# Patient Record
Sex: Female | Born: 1995 | Hispanic: No | Marital: Single | Smoking: Never smoker
Health system: Southern US, Community
[De-identification: ages and names within clinical notes are randomized; demographics above are authoritative.]

## PROBLEM LIST (undated history)

## (undated) DIAGNOSIS — M92529 Juvenile osteochondrosis of tibia tubercle, unspecified leg: Secondary | ICD-10-CM

## (undated) DIAGNOSIS — G43111 Migraine with aura, intractable, with status migrainosus: Secondary | ICD-10-CM

## (undated) DIAGNOSIS — R87629 Unspecified abnormal cytological findings in specimens from vagina: Secondary | ICD-10-CM

## (undated) DIAGNOSIS — F319 Bipolar disorder, unspecified: Secondary | ICD-10-CM

## (undated) DIAGNOSIS — I7300 Raynaud's syndrome without gangrene: Secondary | ICD-10-CM

## (undated) DIAGNOSIS — F909 Attention-deficit hyperactivity disorder, unspecified type: Secondary | ICD-10-CM

## (undated) HISTORY — PX: FOOT SURGERY: SHX648

## (undated) HISTORY — DX: Attention-deficit hyperactivity disorder, unspecified type: F90.9

## (undated) HISTORY — PX: WISDOM TOOTH EXTRACTION: SHX21

---
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
## 2019-01-08 ENCOUNTER — Other Ambulatory Visit: Payer: Self-pay

## 2019-01-08 DIAGNOSIS — Z20822 Contact with and (suspected) exposure to covid-19: Secondary | ICD-10-CM

## 2019-01-10 LAB — NOVEL CORONAVIRUS, NAA: SARS-CoV-2, NAA: NOT DETECTED

## 2019-02-21 ENCOUNTER — Ambulatory Visit
Admission: EM | Admit: 2019-02-21 | Discharge: 2019-02-21 | Disposition: A | Discharge status: Home / Self Care | Payer: BC Managed Care – PPO | Attending: Urgent Care | Admitting: Urgent Care

## 2019-02-21 ENCOUNTER — Other Ambulatory Visit: Payer: Self-pay

## 2019-02-21 DIAGNOSIS — R519 Headache, unspecified: Secondary | ICD-10-CM | POA: Diagnosis present

## 2019-02-21 DIAGNOSIS — R05 Cough: Secondary | ICD-10-CM | POA: Insufficient documentation

## 2019-02-21 DIAGNOSIS — B349 Viral infection, unspecified: Secondary | ICD-10-CM | POA: Insufficient documentation

## 2019-02-21 DIAGNOSIS — Z20828 Contact with and (suspected) exposure to other viral communicable diseases: Secondary | ICD-10-CM | POA: Insufficient documentation

## 2019-02-21 DIAGNOSIS — R070 Pain in throat: Secondary | ICD-10-CM | POA: Diagnosis present

## 2019-02-21 DIAGNOSIS — Z20822 Contact with and (suspected) exposure to covid-19: Secondary | ICD-10-CM

## 2019-02-21 DIAGNOSIS — R059 Cough, unspecified: Secondary | ICD-10-CM

## 2019-02-21 LAB — POCT RAPID STREP A (OFFICE): Rapid Strep A Screen: NEGATIVE

## 2019-02-21 MED ORDER — BENZONATATE 100 MG PO CAPS: 100.0000 mg | ORAL_CAPSULE | Freq: Three times a day (TID) | ORAL | 0 refills | Status: DC | PRN

## 2019-02-21 MED ORDER — PROMETHAZINE-DM 6.25-15 MG/5ML PO SYRP: 5.0000 mL | ORAL_SOLUTION | Freq: Every evening | ORAL | 0 refills | Status: DC | PRN

## 2019-02-21 NOTE — Discharge Instructions (Signed)

## 2019-02-21 NOTE — ED Provider Notes (Signed)
[facility]   MRN: [id number] DOB: 03/03/1995  Subjective:   Amy Randolph is a 23 y.o. female presenting for 2-day history of acute onset mild persistent malaise.  Has had some generalized headaches, moderately stuffy nose in the mild slight cough.  She took some ibuprofen today with some relief.  Denies any known COVID-19 contacts but was in a large crowd of people this past week.  Denies taking chronic medications.    No Known Allergies  Past Medical History:  Diagnosis Date  . ADHD (attention deficit hyperactivity disorder)      Past Surgical History:  Procedure Laterality Date  . WISDOM TOOTH EXTRACTION      Family History  Problem Relation Age of Onset  . Healthy Mother   . Healthy Father     Social History   Tobacco Use  . Smoking status: Never Smoker  . Smokeless tobacco: Never Used  Substance Use Topics  . Alcohol use: Not on file  . Drug use: Not on file    Review of Systems  Constitutional: Negative for fever and malaise/fatigue.  HENT: Positive for sore throat. Negative for congestion, ear pain and sinus pain.   Eyes: Negative for discharge and redness.  Respiratory: Positive for cough. Negative for hemoptysis, shortness of breath and wheezing.   Cardiovascular: Negative for chest pain.  Gastrointestinal: Negative for abdominal pain, diarrhea, nausea and vomiting.  Genitourinary: Negative for dysuria, flank pain and hematuria.  Musculoskeletal: Negative for myalgias.  Skin: Negative for rash.  Neurological: Positive for headaches. Negative for dizziness and weakness.  Psychiatric/Behavioral: Negative for depression and substance abuse.     Objective:   Vitals: BP 118/76 (BP Location: Right Arm)   Pulse 90   Temp 99.6 F (37.6 C) (Oral)   Resp 15   SpO2 98%   Physical Exam Constitutional:      General: She is not in acute distress.    Appearance: Normal appearance. She is well-developed. She is not ill-appearing,  toxic-appearing or diaphoretic.  HENT:     Head: Normocephalic and atraumatic.     Right Ear: Tympanic membrane and ear canal normal. No drainage or tenderness. No middle ear effusion. Tympanic membrane is not erythematous.     Left Ear: Tympanic membrane and ear canal normal. No drainage or tenderness.  No middle ear effusion. Tympanic membrane is not erythematous.     Nose: Nose normal. No congestion or rhinorrhea.     Mouth/Throat:     Mouth: Mucous membranes are moist. No oral lesions.     Pharynx: No pharyngeal swelling, oropharyngeal exudate, posterior oropharyngeal erythema or uvula swelling.     Tonsils: No tonsillar exudate or tonsillar abscesses.     Comments: Significant post-nasal drainage. Eyes:     Extraocular Movements: Extraocular movements intact.     Right eye: Normal extraocular motion.     Left eye: Normal extraocular motion.     Conjunctiva/sclera: Conjunctivae normal.     Pupils: Pupils are equal, round, and reactive to light.  Cardiovascular:     Rate and Rhythm: Normal rate and regular rhythm.     Pulses: Normal pulses.     Heart sounds: Normal heart sounds. No murmur. No friction rub. No gallop.   Pulmonary:     Effort: Pulmonary effort is normal. No respiratory distress.     Breath sounds: Normal breath sounds. No stridor. No wheezing, rhonchi or rales.  Musculoskeletal:     Cervical back: Normal range of motion and neck supple.  Lymphadenopathy:     Cervical: No cervical adenopathy.  Skin:    General: Skin is warm and dry.     Findings: No rash.  Neurological:     General: No focal deficit present.     Mental Status: She is alert and oriented to person, place, and time.  Psychiatric:        Mood and Affect: Mood normal.        Behavior: Behavior normal.        Thought Content: Thought content normal.     Results for orders placed or performed during the hospital encounter of 02/21/19 (from the past 24 hour(s))  POCT rapid strep A     Status: None     Collection Time: 02/21/19 12:50 PM  Result Value Ref Range   Rapid Strep A Screen Negative Negative    Assessment and Plan :   1. Viral illness   2. Throat pain   3. Cough   4. Exposure to COVID-19 virus   5. Generalized headaches     Will manage for viral illness such as viral URI, viral rhinitis, possible COVID-19. Counseled patient on nature of COVID-19 including modes of transmission, diagnostic testing, management and supportive care.  Offered symptomatic relief. COVID 19 testing is pending. Counseled patient on potential for adverse effects with medications prescribed/recommended today, ER and return-to-clinic precautions discussed, patient verbalized understanding.     Jaynee Eagles, PA-C 02/21/19 1353

## 2019-02-21 NOTE — ED Triage Notes (Signed)
Pt presents to UC w/ c/o sore throat, slight cough, congestion x2 days.

## 2019-02-23 LAB — NOVEL CORONAVIRUS, NAA: SARS-CoV-2, NAA: NOT DETECTED

## 2019-02-24 LAB — CULTURE, GROUP A STREP (THRC)

## 2019-03-20 ENCOUNTER — Ambulatory Visit: Payer: BC Managed Care – PPO | Attending: Internal Medicine

## 2019-03-20 ENCOUNTER — Other Ambulatory Visit: Payer: Self-pay

## 2019-03-20 DIAGNOSIS — Z20822 Contact with and (suspected) exposure to covid-19: Secondary | ICD-10-CM

## 2019-03-21 LAB — NOVEL CORONAVIRUS, NAA: SARS-CoV-2, NAA: NOT DETECTED

## 2019-08-19 ENCOUNTER — Other Ambulatory Visit: Payer: Self-pay

## 2019-08-19 ENCOUNTER — Ambulatory Visit
Admission: EM | Admit: 2019-08-19 | Discharge: 2019-08-19 | Disposition: A | Discharge status: Home / Self Care | Payer: BC Managed Care – PPO | Attending: Emergency Medicine | Admitting: Emergency Medicine

## 2019-08-19 ENCOUNTER — Encounter: Payer: Self-pay | Admitting: Emergency Medicine

## 2019-08-19 DIAGNOSIS — J069 Acute upper respiratory infection, unspecified: Secondary | ICD-10-CM

## 2019-08-19 DIAGNOSIS — J029 Acute pharyngitis, unspecified: Secondary | ICD-10-CM | POA: Diagnosis present

## 2019-08-19 LAB — POCT RAPID STREP A (OFFICE): Rapid Strep A Screen: NEGATIVE

## 2019-08-19 MED ORDER — FLUTICASONE PROPIONATE 50 MCG/ACT NA SUSP: 1.0000 | Freq: Every day | NASAL | 0 refills | Status: DC

## 2019-08-19 MED ORDER — LIDOCAINE VISCOUS HCL 2 % MT SOLN: 15.0000 mL | OROMUCOSAL | 0 refills | Status: DC | PRN

## 2019-08-19 NOTE — Discharge Instructions (Addendum)
Strep test negative, will send out for culture and we will call you with results Get plenty of rest and push fluids Continue to take Zyrtec as prescribed Flonase was prescribed Viscous lidocaine prescribed.  This is an oral solution you can swish, and gargle as needed for symptomatic relief of sore throat.  Do not exceed 8 doses in a 24 hour period.  Do not use prior to eating, as this will numb your entire mouth.   Drink warm or cool liquids, use throat lozenges, or popsicles to help alleviate symptoms Take OTC ibuprofen or tylenol as needed for pain Follow up with PCP if symptoms persists Return or go to ER if patient has any new or worsening symptoms such as fever, chills, nausea, vomiting, worsening sore throat, cough, abdominal pain, chest pain, changes in bowel or bladder habits, etc..Marland Kitchen

## 2019-08-19 NOTE — ED Triage Notes (Signed)
Sore throat and nasal congestion since yesterday. Reports hx of strep.

## 2019-08-19 NOTE — ED Provider Notes (Addendum)
[place]   [id number] 08/19/19 Arrival Time: 4403   Chief Complaint  Patient presents with  . Sore Throat     SUBJECTIVE: History from: patient.  Amy Randolph is a 24 y.o. female with history of strep  presents to the urgent care with a complaint of sore throat and nasal congestion that started yesterday.  Denies sick exposure to COVID, flu or strep.  Had both Covid vaccine.  Denies recent travel.  Has not tried any OTC medication.  Symptoms are made worse with eating.  Reports previous symptoms in the past.   Denies fever, chills, fatigue, sinus pain, rhinorrhea, sore throat, SOB, wheezing, chest pain, nausea, changes in bowel or bladder habits.         Review of Systems  Constitutional: Negative.   HENT: Positive for congestion and sore throat.   Respiratory: Negative.   Cardiovascular: Negative.   Gastrointestinal: Negative.   Neurological: Negative.   All other systems reviewed and are negative.        Past Medical History:  Diagnosis Date  . ADHD (attention deficit hyperactivity disorder)    Past Surgical History:  Procedure Laterality Date  . WISDOM TOOTH EXTRACTION     No Known Allergies No current facility-administered medications on file prior to encounter.   Current Outpatient Medications on File Prior to Encounter  Medication Sig Dispense Refill  . benzonatate (TESSALON) 100 MG capsule Take 1-2 capsules (100-200 mg total) by mouth 3 (three) times daily as needed. 60 capsule 0  . promethazine-dextromethorphan (PROMETHAZINE-DM) 6.25-15 MG/5ML syrup Take 5 mLs by mouth at bedtime as needed for cough. 100 mL 0  . [DISCONTINUED] methylphenidate (CONCERTA) 54 MG CR tablet Take 54 mg by mouth 2 (two) times daily.      . [DISCONTINUED] promethazine (PHENERGAN) 25 MG tablet      Social History   Socioeconomic History  . Marital status: Single    Spouse name: Not on file  . Number of children: Not on file  . Years of education: Not on file    . Highest education level: Not on file  Occupational History  . Not on file  Tobacco Use  . Smoking status: Never Smoker  . Smokeless tobacco: Never Used  Substance and Sexual Activity  . Alcohol use: Yes    Comment: occ  . Drug use: Not on file  . Sexual activity: Not on file  Other Topics Concern  . Not on file  Social History Narrative  . Not on file   Social Determinants of Health   Financial Resource Strain:   . Difficulty of Paying Living Expenses:   Food Insecurity:   . Worried About Charity fundraiser in the Last Year:   . Arboriculturist in the Last Year:   Transportation Needs:   . Film/video editor (Medical):   Marland Kitchen Lack of Transportation (Non-Medical):   Physical Activity:   . Days of Exercise per Week:   . Minutes of Exercise per Session:   Stress:   . Feeling of Stress :   Social Connections:   . Frequency of Communication with Friends and Family:   . Frequency of Social Gatherings with Friends and Family:   . Attends Religious Services:   . Active Member of Clubs or Organizations:   . Attends Archivist Meetings:   Marland Kitchen Marital Status:   Intimate Partner Violence:   . Fear of Current or Ex-Partner:   . Emotionally Abused:   Marland Kitchen Physically  Abused:   . Sexually Abused:    Family History  Problem Relation Age of Onset  . Healthy Mother   . Healthy Father     OBJECTIVE:  Vitals:   08/19/19 1150 08/19/19 1153  BP:  105/74  Pulse:  96  Resp:  17  Temp:  98.6 F (37 C)  TempSrc:  Oral  SpO2:  96%  Weight: 150 lb (68 kg)   Height: 5\' 6"  (1.676 m)      General appearance: alert; appears fatigued, but nontoxic; speaking in full sentences and tolerating own secretions HEENT: NCAT; Ears: EACs clear, TMs pearly gray; Eyes: PERRL.  EOM grossly intact. Sinuses: nontender; Nose:  Congestion,nares patent without rhinorrhea, Throat: oropharynx clear, tonsils non erythematous or enlarged, uvula midline  Neck: supple without LAD Lungs:  unlabored respirations, symmetrical air entry; cough: absent; no respiratory distress; CTAB Heart: regular rate and rhythm.  Radial pulses 2+ symmetrical bilaterally Skin: warm and dry Psychological: alert and cooperative; normal mood and affect  LABS:  Results for orders placed or performed during the hospital encounter of 08/19/19 (from the past 24 hour(s))  POCT rapid strep A     Status: None   Collection Time: 08/19/19 11:58 AM  Result Value Ref Range   Rapid Strep A Screen Negative Negative     ASSESSMENT & PLAN:  1. Sore throat   2. Acute URI     Meds ordered this encounter  Medications  . lidocaine (XYLOCAINE) 2 % solution    Sig: Use as directed 15 mLs in the mouth or throat as needed for mouth pain.    Dispense:  100 mL    Refill:  0  . fluticasone (FLONASE) 50 MCG/ACT nasal spray    Sig: Place 1 spray into both nostrils daily for 14 days.    Dispense:  16 g    Refill:  0    Discharge instructions  Strep test negative, will send out for culture and we will call you with results Get plenty of rest and push fluids Continue to take Zyrtec as prescribed Flonase was prescribed Viscous lidocaine prescribed.  This is an oral solution you can swish, and gargle as needed for symptomatic relief of sore throat.  Do not exceed 8 doses in a 24 hour period.  Do not use prior to eating, as this will numb your entire mouth.   Drink warm or cool liquids, use throat lozenges, or popsicles to help alleviate symptoms Take OTC ibuprofen or tylenol as needed for pain Follow up with PCP if symptoms persists Return or go to ER if patient has any new or worsening symptoms such as fever, chills, nausea, vomiting, worsening sore throat, cough, abdominal pain, chest pain, changes in bowel or bladder habits, etc...  Reviewed expectations re: course of current medical issues. Questions answered. Outlined signs and symptoms indicating need for more acute intervention. Patient verbalized  understanding. After Visit Summary given.        Note: This document was prepared using Dragon voice recognition software and may include unintentional dictation errors.     08/21/19, FNP 08/19/19 1213    08/21/19, FNP 08/19/19 1216

## 2019-08-22 LAB — CULTURE, GROUP A STREP (THRC)

## 2019-11-16 ENCOUNTER — Ambulatory Visit: Payer: Self-pay | Admitting: Podiatry

## 2019-11-26 ENCOUNTER — Encounter: Payer: Self-pay | Admitting: Podiatry

## 2019-11-26 ENCOUNTER — Other Ambulatory Visit: Payer: Self-pay

## 2019-11-26 ENCOUNTER — Ambulatory Visit (INDEPENDENT_AMBULATORY_CARE_PROVIDER_SITE_OTHER): Payer: BC Managed Care – PPO | Admitting: Podiatry

## 2019-11-26 ENCOUNTER — Ambulatory Visit (INDEPENDENT_AMBULATORY_CARE_PROVIDER_SITE_OTHER): Payer: BC Managed Care – PPO

## 2019-11-26 DIAGNOSIS — M79672 Pain in left foot: Secondary | ICD-10-CM

## 2019-11-26 DIAGNOSIS — M7662 Achilles tendinitis, left leg: Secondary | ICD-10-CM

## 2019-11-26 MED ORDER — DICLOFENAC SODIUM 75 MG PO TBEC: 75.0000 mg | DELAYED_RELEASE_TABLET | Freq: Two times a day (BID) | ORAL | 2 refills | Status: DC

## 2019-11-26 NOTE — Progress Notes (Signed)
Subjective:   Patient ID: Amy Randolph, female   DOB: 24 y.o.   MRN: [id number]   HPI Patient presents stating that she is having a lot of pain in the back of her left heel and its been sore and making it hard for her to walk.  Does not remember specific injury and states it is been hurting for around 2 months.  Patient does not smoke likes to be active   Review of Systems  All other systems reviewed and are negative.       Objective:  Physical Exam Vitals and nursing note reviewed.  Constitutional:      Appearance: She is well-developed.  Pulmonary:     Effort: Pulmonary effort is normal.  Musculoskeletal:        General: Normal range of motion.  Skin:    General: Skin is warm.  Neurological:     Mental Status: She is alert.     Neurovascular status intact muscle strength found to be adequate range of motion found to be within normal limits with posterior lateral discomfort of the Achilles at the insertional point into the calcaneus.  Mild equinus condition noted with inflammation pain with palpation and good digital perfusion of the digits noted     Assessment:  Acute Achilles tendinitis left with inflammation fluid of the lateral side     Plan:  H&P reviewed conditions.  At this point I recommend an injection I did explain procedure risk and also chances for rupture and she wants to have the procedure done and I did a sterile prep and carefully injected the lateral side 3 mg dexamethasone 5 mg Xylocaine keep it away center and medial and advised on reduced activity and ice therapy.  Patient will be seen back to recheck again next several weeks or earlier if needed  X-rays are negative for signs of spur formation stress fracture or other bone pathology associated with condition

## 2019-11-26 NOTE — Patient Instructions (Signed)

## 2019-12-17 ENCOUNTER — Other Ambulatory Visit: Payer: Self-pay

## 2019-12-17 ENCOUNTER — Encounter: Payer: Self-pay | Admitting: Podiatry

## 2019-12-17 ENCOUNTER — Ambulatory Visit (INDEPENDENT_AMBULATORY_CARE_PROVIDER_SITE_OTHER): Payer: BC Managed Care – PPO | Admitting: Podiatry

## 2019-12-17 DIAGNOSIS — M7662 Achilles tendinitis, left leg: Secondary | ICD-10-CM

## 2019-12-17 NOTE — Progress Notes (Signed)
Subjective:   Patient ID: Amy Randolph, female   DOB: 24 y.o.   MRN: [id number]   HPI Patient presents stating that she is having pain still in her Achilles tendon left and it seems like it is moved somewhat from where we treated it previously   ROS      Objective:  Physical Exam  Neurovascular status intact with patient's insertional point of the Achilles is doing better on the lateral side over the Achilles but the pain is more proximal now near the muscle tendon junction in a different spot than where I saw 2 weeks ago     Assessment:  Achilles tendinitis left more the muscle tendon junction versus insertional     Plan:  Since she still having pain I went ahead today and I recommended immobilization with Cam walker and physical therapy for 3 to 4 weeks. Explained this may take time ultimately could require MRI and patient be seen back 4 weeks after she has been immobilized and does physical therapy. Educated her on deformity

## 2019-12-17 NOTE — Addendum Note (Signed)
Addended by: Melene Plan on: 12/17/2019 04:23 PM   Modules accepted: Orders

## 2020-01-14 ENCOUNTER — Other Ambulatory Visit: Payer: Self-pay

## 2020-01-14 ENCOUNTER — Ambulatory Visit (INDEPENDENT_AMBULATORY_CARE_PROVIDER_SITE_OTHER): Payer: BC Managed Care – PPO | Admitting: Podiatry

## 2020-01-14 ENCOUNTER — Encounter: Payer: Self-pay | Admitting: Podiatry

## 2020-01-14 DIAGNOSIS — M7662 Achilles tendinitis, left leg: Secondary | ICD-10-CM | POA: Diagnosis not present

## 2020-01-14 NOTE — Progress Notes (Signed)
Subjective:   Patient ID: Amy Randolph, female   DOB: 24 y.o.   MRN: [id number]   HPI Patient presents improved but still having some issues with her Achilles if she is not wearing the boot   ROS      Objective:  Physical Exam  Neurovascular status intact with mild discomfort in the posterior heel lift left not at the insertion but within the muscle tendon junction with mild swelling still noted but much improved from previous visit     Assessment:  Achilles tendinitis which is a more difficult 1 to treat at the muscle tendon junction with improvement but pain upon deep palpation     Plan:  H&P spent a great deal of time reviewing condition and recommended stretching exercises continued heat and ice boot usage which I educated her on and anti-inflammatories.  Patient will be seen back for [place] to recheck again and I encouraged her to gradually increase her activity and we reviewed what would be a good plan to do that

## 2020-04-18 ENCOUNTER — Ambulatory Visit
Admission: EM | Admit: 2020-04-18 | Discharge: 2020-04-18 | Disposition: A | Discharge status: Home / Self Care | Payer: BC Managed Care – PPO | Attending: Family Medicine | Admitting: Family Medicine

## 2020-04-18 ENCOUNTER — Encounter: Payer: Self-pay | Admitting: Emergency Medicine

## 2020-04-18 ENCOUNTER — Other Ambulatory Visit: Payer: Self-pay

## 2020-04-18 DIAGNOSIS — N3001 Acute cystitis with hematuria: Secondary | ICD-10-CM

## 2020-04-18 DIAGNOSIS — Z3202 Encounter for pregnancy test, result negative: Secondary | ICD-10-CM

## 2020-04-18 DIAGNOSIS — R350 Frequency of micturition: Secondary | ICD-10-CM

## 2020-04-18 DIAGNOSIS — R300 Dysuria: Secondary | ICD-10-CM | POA: Diagnosis not present

## 2020-04-18 LAB — POCT URINALYSIS DIP (MANUAL ENTRY)
Glucose, UA: 100 mg/dL — AB
Nitrite, UA: POSITIVE — AB
Protein Ur, POC: >=300 mg/dL — AB
Spec Grav, UA: 1.02 (ref 1.010–1.025)
Urobilinogen, UA: 2 E.U./dL — AB
pH, UA: 5 (ref 5.0–8.0)

## 2020-04-18 LAB — POCT URINE PREGNANCY: Preg Test, Ur: NEGATIVE

## 2020-04-18 MED ORDER — CEPHALEXIN 500 MG PO CAPS: 500.0000 mg | ORAL_CAPSULE | Freq: Two times a day (BID) | ORAL | 0 refills | Status: AC

## 2020-04-18 MED ORDER — TAMSULOSIN HCL 0.4 MG PO CAPS: 0.4000 mg | ORAL_CAPSULE | Freq: Every day | ORAL | 0 refills | Status: DC

## 2020-04-18 NOTE — Discharge Instructions (Signed)
I have sent in Keflex for you to take twice a day for 7 days  I have sent in flomax for you as well  May continue AZO  Negative pregnancy test  Follow up with this office or with primary care if symptoms are persisting.  Follow up in the ER for high fever, trouble swallowing, trouble breathing, other concerning symptoms.

## 2020-04-18 NOTE — ED Triage Notes (Signed)
Pain in lower abd that started this morning.  Burning with urination yesterday but resolved after taking azo.

## 2020-04-20 NOTE — ED Provider Notes (Signed)
[facility]   CC: UTI  SUBJECTIVE:  Amy Randolph is a 25 y.o. female who complains of urinary frequency, urgency and dysuria for the past 2 days. Has been taking AZO with pain relief. Patient denies a precipitating event, recent sexual encounter, excessive caffeine intake. Localizes the pain to the lower abdomen. Pain is intermittent and describes it as sharp. Symptoms are made worse with urination. Admits to similar symptoms in the past.  Denies fever, chills, nausea, vomiting, flank pain, abnormal vaginal discharge or bleeding, hematuria.    LMP: Patient's last menstrual period was 04/18/2020.  ROS: As in HPI.  All other pertinent ROS negative.     Past Medical History:  Diagnosis Date  . ADHD (attention deficit hyperactivity disorder)    Past Surgical History:  Procedure Laterality Date  . FOOT SURGERY    . WISDOM TOOTH EXTRACTION     No Known Allergies No current facility-administered medications on file prior to encounter.   Current Outpatient Medications on File Prior to Encounter  Medication Sig Dispense Refill  . benzonatate (TESSALON) 100 MG capsule Take 1-2 capsules (100-200 mg total) by mouth 3 (three) times daily as needed. 60 capsule 0  . buPROPion (WELLBUTRIN XL) 150 MG 24 hr tablet Wellbutrin XL 150 mg 24 hr tablet, extended release  TK 1 T PO QAM    . diclofenac (VOLTAREN) 75 MG EC tablet Take 1 tablet (75 mg total) by mouth 2 (two) times daily. 50 tablet 2  . etonogestrel-ethinyl estradiol (NUVARING) 0.12-0.015 MG/24HR vaginal ring etonogestrel 0.12 mg-ethinyl estradiol 0.015 mg/24 hr vaginal ring  INSERT 1 RING VAGINALLY FOR 3 WEEKS THEN REMOVE FOR 1 WEEK    . fluticasone (FLONASE) 50 MCG/ACT nasal spray Place 1 spray into both nostrils daily for 14 days. 16 g 0  . lidocaine (XYLOCAINE) 2 % solution Use as directed 15 mLs in the mouth or throat as needed for mouth pain. 100 mL 0  . montelukast (SINGULAIR) 10 MG tablet     . norelgestromin-ethinyl  estradiol [employer]) 150-35 MCG/24HR transdermal patch Xulane 150 mcg-35 mcg/24 hr transdermal patch  APPLY 1 PATCH TOPICALLY TO THE SKIN EVERY WEEK    . Norethindrone Acetate-Ethinyl Estradiol (LOESTRIN) 1.5-30 MG-MCG tablet     . promethazine-dextromethorphan (PROMETHAZINE-DM) 6.25-15 MG/5ML syrup Take 5 mLs by mouth at bedtime as needed for cough. 100 mL 0  . ZAFEMY 150-35 MCG/24HR transdermal patch     . [DISCONTINUED] methylphenidate (CONCERTA) 54 MG CR tablet Take 54 mg by mouth 2 (two) times daily.      . [DISCONTINUED] promethazine (PHENERGAN) 25 MG tablet      Social History   Socioeconomic History  . Marital status: Single    Spouse name: Not on file  . Number of children: Not on file  . Years of education: Not on file  . Highest education level: Not on file  Occupational History  . Not on file  Tobacco Use  . Smoking status: Never Smoker  . Smokeless tobacco: Never Used  Substance and Sexual Activity  . Alcohol use: Yes    Comment: occ  . Drug use: Not on file  . Sexual activity: Not on file  Other Topics Concern  . Not on file  Social History Narrative  . Not on file   Social Determinants of Health   Financial Resource Strain: Not on file  Food Insecurity: Not on file  Transportation Needs: Not on file  Physical Activity: Not on file  Stress: Not on file  Social Connections: Not on file  Intimate Partner Violence: Not on file   Family History  Problem Relation Age of Onset  . Healthy Mother   . Healthy Father     OBJECTIVE:  Vitals:   04/18/20 1439  BP: 112/79  Pulse: 88  Resp: 18  Temp: 98.1 F (36.7 C)  TempSrc: Oral  SpO2: 97%   General appearance: AOx3 in no acute distress HEENT: NCAT. Oropharynx clear.  Lungs: clear to auscultation bilaterally without adventitious breath sounds Heart: regular rate and rhythm. Radial pulses 2+ symmetrical bilaterally Abdomen: soft; non-distended; suprapubic tenderness; bowel sounds present; no guarding or  rebound tenderness Back: no CVA tenderness Extremities: no edema; symmetrical with no gross deformities Skin: warm and dry Neurologic: Ambulates from chair to exam table without difficulty Psychological: alert and cooperative; normal mood and affect  Labs Reviewed  POCT URINALYSIS DIP (MANUAL ENTRY) - Abnormal; Notable for the following components:      Result Value   Color, UA red (*)    Clarity, UA cloudy (*)    Glucose, UA =100 (*)    Bilirubin, UA small (*)    Ketones, POC UA trace (5) (*)    Blood, UA moderate (*)    Protein Ur, POC >=300 (*)    Urobilinogen, UA 2.0 (*)    Nitrite, UA Positive (*)    Leukocytes, UA Large (3+) (*)    All other components within normal limits  POCT URINE PREGNANCY    ASSESSMENT & PLAN:  1. Acute cystitis with hematuria   2. Dysuria   3. Urinary frequency   4. Negative pregnancy test     Meds ordered this encounter  Medications  . cephALEXin (KEFLEX) 500 MG capsule    Sig: Take 1 capsule (500 mg total) by mouth 2 (two) times daily for 7 days.    Dispense:  14 capsule    Refill:  0    Order Specific Question:   Supervising Provider    Answer:   Merrilee Jansky [id number]  . tamsulosin (FLOMAX) 0.4 MG CAPS capsule    Sig: Take 1 capsule (0.4 mg total) by mouth daily.    Dispense:  30 capsule    Refill:  0    Order Specific Question:   Supervising Provider    Answer:   Merrilee Jansky [id number]    Urine culture sent  We will call you with abnormal results that need further treatment Push fluids and get plenty of rest Prescribed flomax Prescribed Keflex BID x 7 days Take antibiotic as directed and to completion Take pyridium as prescribed and as needed for symptomatic relief Follow up with PCP if symptoms persists Return here or go to ER if you have any new or worsening symptoms such as fever, worsening abdominal pain, nausea/vomiting, flank pain  Outlined signs and symptoms indicating need for more acute  intervention Patient verbalized understanding After Visit Summary given     Moshe Cipro, NP 04/20/20 1419

## 2020-08-22 LAB — OB RESULTS CONSOLE ABO/RH: RH Type: POSITIVE

## 2020-08-22 LAB — OB RESULTS CONSOLE RPR: RPR: NONREACTIVE

## 2020-08-22 LAB — OB RESULTS CONSOLE ANTIBODY SCREEN: Antibody Screen: NEGATIVE

## 2020-08-22 LAB — HEPATITIS C ANTIBODY: HCV Ab: NEGATIVE

## 2020-08-22 LAB — OB RESULTS CONSOLE HIV ANTIBODY (ROUTINE TESTING): HIV: NONREACTIVE

## 2020-08-22 LAB — OB RESULTS CONSOLE HEPATITIS B SURFACE ANTIGEN: Hepatitis B Surface Ag: NEGATIVE

## 2020-08-22 LAB — OB RESULTS CONSOLE RUBELLA ANTIBODY, IGM: Rubella: IMMUNE

## 2020-09-03 LAB — OB RESULTS CONSOLE GC/CHLAMYDIA
Chlamydia: NEGATIVE
Gonorrhea: NEGATIVE

## 2021-03-01 NOTE — L&D Delivery Note (Signed)
Operative Delivery Note At 1:27 PM a viable female was delivered via .  Presentation: vertex; Position: Occiput,, Posterior; Station: +2.  Verbal consent: obtained from patient.  Risks and benefits discussed in detail.  Risks include, but are not limited to the risks of anesthesia, bleeding, infection, damage to maternal tissues, fetal cephalhematoma.  There is also the risk of inability to effect vaginal delivery of the head, or shoulder dystocia that cannot be resolved by established maneuvers, leading to the need for emergency cesarean section.  APGAR:  8 and 9 weight pending  .   Placenta status: spontaneously with 3 vessel cord , .   Cord:  with the following complications:none .  Cord pH not obtained  Anesthesia:  epidrual Instruments: mushroom with 3 pulls and no popoffs Episiotomy:  none Lacerations:  first  Suture Repair: 2.0 3.0 chromic Est. Blood Loss (mL):  300  Mom to postpartum.  Baby to Couplet care / Skin to Skin.  Amy Randolph 03/25/2021, 1:40 PM

## 2021-03-03 LAB — OB RESULTS CONSOLE GBS: GBS: NEGATIVE

## 2021-03-24 ENCOUNTER — Other Ambulatory Visit: Payer: Self-pay

## 2021-03-24 ENCOUNTER — Encounter (HOSPITAL_COMMUNITY): Payer: Self-pay | Admitting: *Deleted

## 2021-03-25 ENCOUNTER — Encounter (HOSPITAL_COMMUNITY): Payer: Self-pay | Admitting: Obstetrics and Gynecology

## 2021-03-25 ENCOUNTER — Inpatient Hospital Stay (HOSPITAL_COMMUNITY): Payer: Medicaid Other | Admitting: Anesthesiology

## 2021-03-25 ENCOUNTER — Inpatient Hospital Stay (HOSPITAL_COMMUNITY)
Admission: AD | Admit: 2021-03-25 | Discharge: 2021-03-27 | DRG: 807 | Disposition: A | Discharge status: Home / Self Care | Payer: Medicaid Other | Attending: Obstetrics and Gynecology | Admitting: Obstetrics and Gynecology

## 2021-03-25 ENCOUNTER — Inpatient Hospital Stay (HOSPITAL_COMMUNITY): Payer: Medicaid Other

## 2021-03-25 DIAGNOSIS — O26893 Other specified pregnancy related conditions, third trimester: Principal | ICD-10-CM | POA: Diagnosis present

## 2021-03-25 DIAGNOSIS — Z3A39 39 weeks gestation of pregnancy: Secondary | ICD-10-CM | POA: Diagnosis not present

## 2021-03-25 DIAGNOSIS — Z3490 Encounter for supervision of normal pregnancy, unspecified, unspecified trimester: Secondary | ICD-10-CM | POA: Diagnosis present

## 2021-03-25 DIAGNOSIS — Z20822 Contact with and (suspected) exposure to covid-19: Secondary | ICD-10-CM | POA: Diagnosis present

## 2021-03-25 HISTORY — DX: Migraine with aura, intractable, with status migrainosus: G43.111

## 2021-03-25 HISTORY — DX: Bipolar disorder, unspecified: F31.9

## 2021-03-25 HISTORY — DX: Juvenile osteochondrosis of tibia tubercle, unspecified leg: M92.529

## 2021-03-25 HISTORY — DX: Unspecified abnormal cytological findings in specimens from vagina: R87.629

## 2021-03-25 HISTORY — DX: Raynaud's syndrome without gangrene: I73.00

## 2021-03-25 LAB — RPR: RPR Ser Ql: NONREACTIVE

## 2021-03-25 LAB — CBC
HCT: 39 % (ref 36.0–46.0)
Hemoglobin: 13.1 g/dL (ref 12.0–15.0)
MCH: 29.5 pg (ref 26.0–34.0)
MCHC: 33.6 g/dL (ref 30.0–36.0)
MCV: 87.8 fL (ref 80.0–100.0)
Platelets: 263 10*3/uL (ref 150–400)
RBC: 4.44 MIL/uL (ref 3.87–5.11)
RDW: 13.7 % (ref 11.5–15.5)
WBC: 12.2 10*3/uL — ABNORMAL HIGH (ref 4.0–10.5)
nRBC: 0 % (ref 0.0–0.2)

## 2021-03-25 LAB — TYPE AND SCREEN
ABO/RH(D): O POS
Antibody Screen: NEGATIVE

## 2021-03-25 LAB — RESP PANEL BY RT-PCR (FLU A&B, COVID) ARPGX2
Influenza A by PCR: NEGATIVE
Influenza B by PCR: NEGATIVE
SARS Coronavirus 2 by RT PCR: NEGATIVE

## 2021-03-25 MED ORDER — OXYTOCIN-SODIUM CHLORIDE 30-0.9 UT/500ML-% IV SOLN
2.5000 [IU]/h | INTRAVENOUS | Status: DC
  Filled 2021-03-25: qty 500

## 2021-03-25 MED ORDER — DIBUCAINE (PERIANAL) 1 % EX OINT: 1.0000 "application " | TOPICAL_OINTMENT | CUTANEOUS | Status: DC | PRN

## 2021-03-25 MED ORDER — MEASLES, MUMPS & RUBELLA VAC IJ SOLR: 0.5000 mL | Freq: Once | INTRAMUSCULAR | Status: DC

## 2021-03-25 MED ORDER — COCONUT OIL OIL: 1.0000 "application " | TOPICAL_OIL | Status: DC | PRN

## 2021-03-25 MED ORDER — LACTATED RINGERS IV SOLN: 500.0000 mL | Freq: Once | INTRAVENOUS | Status: DC

## 2021-03-25 MED ORDER — PHENYLEPHRINE 40 MCG/ML (10ML) SYRINGE FOR IV PUSH (FOR BLOOD PRESSURE SUPPORT): 80.0000 ug | PREFILLED_SYRINGE | INTRAVENOUS | Status: DC | PRN

## 2021-03-25 MED ORDER — DIPHENHYDRAMINE HCL 50 MG/ML IJ SOLN: 12.5000 mg | INTRAMUSCULAR | Status: DC | PRN

## 2021-03-25 MED ORDER — FLEET ENEMA 7-19 GM/118ML RE ENEM: 1.0000 | ENEMA | Freq: Every day | RECTAL | Status: DC | PRN

## 2021-03-25 MED ORDER — TERBUTALINE SULFATE 1 MG/ML IJ SOLN: 0.2500 mg | Freq: Once | INTRAMUSCULAR | Status: DC | PRN

## 2021-03-25 MED ORDER — ONDANSETRON HCL 4 MG/2ML IJ SOLN: 4.0000 mg | INTRAMUSCULAR | Status: DC | PRN

## 2021-03-25 MED ORDER — OXYTOCIN-SODIUM CHLORIDE 30-0.9 UT/500ML-% IV SOLN
1.0000 m[IU]/min | INTRAVENOUS | Status: DC
  Administered 2021-03-25: 05:00:00 2 m[IU]/min via INTRAVENOUS

## 2021-03-25 MED ORDER — OXYTOCIN BOLUS FROM INFUSION
333.0000 mL | Freq: Once | INTRAVENOUS | Status: AC
  Administered 2021-03-25: 14:00:00 333 mL via INTRAVENOUS

## 2021-03-25 MED ORDER — DIPHENHYDRAMINE HCL 25 MG PO CAPS: 25.0000 mg | ORAL_CAPSULE | Freq: Four times a day (QID) | ORAL | Status: DC | PRN

## 2021-03-25 MED ORDER — OXYCODONE-ACETAMINOPHEN 5-325 MG PO TABS: 2.0000 | ORAL_TABLET | ORAL | Status: DC | PRN

## 2021-03-25 MED ORDER — ONDANSETRON HCL 4 MG PO TABS: 4.0000 mg | ORAL_TABLET | ORAL | Status: DC | PRN

## 2021-03-25 MED ORDER — PRENATAL MULTIVITAMIN CH
1.0000 | ORAL_TABLET | Freq: Every day | ORAL | Status: DC
  Administered 2021-03-26 – 2021-03-27 (×2): 1 via ORAL
  Filled 2021-03-25 (×2): qty 1

## 2021-03-25 MED ORDER — LACTATED RINGERS IV SOLN: INTRAVENOUS | Status: DC

## 2021-03-25 MED ORDER — SENNOSIDES-DOCUSATE SODIUM 8.6-50 MG PO TABS
2.0000 | ORAL_TABLET | Freq: Every day | ORAL | Status: DC
  Administered 2021-03-26: 2 via ORAL
  Filled 2021-03-25: qty 2

## 2021-03-25 MED ORDER — WITCH HAZEL-GLYCERIN EX PADS: 1.0000 "application " | MEDICATED_PAD | CUTANEOUS | Status: DC | PRN

## 2021-03-25 MED ORDER — SIMETHICONE 80 MG PO CHEW: 80.0000 mg | CHEWABLE_TABLET | ORAL | Status: DC | PRN

## 2021-03-25 MED ORDER — ACETAMINOPHEN 325 MG PO TABS: 650.0000 mg | ORAL_TABLET | ORAL | Status: DC | PRN

## 2021-03-25 MED ORDER — EPHEDRINE 5 MG/ML INJ: 10.0000 mg | INTRAVENOUS | Status: DC | PRN

## 2021-03-25 MED ORDER — BUTORPHANOL TARTRATE 1 MG/ML IJ SOLN: 1.0000 mg | INTRAMUSCULAR | Status: DC | PRN

## 2021-03-25 MED ORDER — MEDROXYPROGESTERONE ACETATE 150 MG/ML IM SUSP: 150.0000 mg | INTRAMUSCULAR | Status: DC | PRN

## 2021-03-25 MED ORDER — LACTATED RINGERS IV SOLN: 500.0000 mL | INTRAVENOUS | Status: DC | PRN

## 2021-03-25 MED ORDER — ZOLPIDEM TARTRATE 5 MG PO TABS: 5.0000 mg | ORAL_TABLET | Freq: Every evening | ORAL | Status: DC | PRN

## 2021-03-25 MED ORDER — LIDOCAINE HCL (PF) 1 % IJ SOLN
INTRAMUSCULAR | Status: DC | PRN
  Administered 2021-03-25: 4 mL via EPIDURAL
  Administered 2021-03-25: 6 mL via EPIDURAL

## 2021-03-25 MED ORDER — SOD CITRATE-CITRIC ACID 500-334 MG/5ML PO SOLN
30.0000 mL | ORAL | Status: DC | PRN
  Administered 2021-03-25: 01:00:00 30 mL via ORAL
  Filled 2021-03-25: qty 30

## 2021-03-25 MED ORDER — BISACODYL 10 MG RE SUPP: 10.0000 mg | Freq: Every day | RECTAL | Status: DC | PRN

## 2021-03-25 MED ORDER — FENTANYL-BUPIVACAINE-NACL 0.5-0.125-0.9 MG/250ML-% EP SOLN
12.0000 mL/h | EPIDURAL | Status: DC | PRN
  Administered 2021-03-25: 06:00:00 12 mL/h via EPIDURAL
  Filled 2021-03-25: qty 250

## 2021-03-25 MED ORDER — BENZOCAINE-MENTHOL 20-0.5 % EX AERO
1.0000 "application " | INHALATION_SPRAY | CUTANEOUS | Status: DC | PRN
  Administered 2021-03-25: 1 via TOPICAL
  Filled 2021-03-25: qty 56

## 2021-03-25 MED ORDER — LIDOCAINE HCL (PF) 1 % IJ SOLN: 30.0000 mL | INTRAMUSCULAR | Status: DC | PRN

## 2021-03-25 MED ORDER — ACETAMINOPHEN 325 MG PO TABS
650.0000 mg | ORAL_TABLET | ORAL | Status: DC | PRN
  Administered 2021-03-26: 650 mg via ORAL
  Filled 2021-03-25: qty 2

## 2021-03-25 MED ORDER — OXYCODONE-ACETAMINOPHEN 5-325 MG PO TABS: 1.0000 | ORAL_TABLET | ORAL | Status: DC | PRN

## 2021-03-25 MED ORDER — MISOPROSTOL 25 MCG QUARTER TABLET
25.0000 ug | ORAL_TABLET | ORAL | Status: DC | PRN
  Administered 2021-03-25: 01:00:00 25 ug via VAGINAL
  Filled 2021-03-25: qty 1

## 2021-03-25 MED ORDER — TETANUS-DIPHTH-ACELL PERTUSSIS 5-2.5-18.5 LF-MCG/0.5 IM SUSY: 0.5000 mL | PREFILLED_SYRINGE | Freq: Once | INTRAMUSCULAR | Status: DC

## 2021-03-25 MED ORDER — ONDANSETRON HCL 4 MG/2ML IJ SOLN
4.0000 mg | Freq: Four times a day (QID) | INTRAMUSCULAR | Status: DC | PRN
  Administered 2021-03-25: 06:00:00 4 mg via INTRAVENOUS
  Filled 2021-03-25: qty 2

## 2021-03-25 MED ORDER — IBUPROFEN 600 MG PO TABS
600.0000 mg | ORAL_TABLET | Freq: Four times a day (QID) | ORAL | Status: DC
  Administered 2021-03-25 – 2021-03-27 (×8): 600 mg via ORAL
  Filled 2021-03-25 (×8): qty 1

## 2021-03-25 NOTE — H&P (Signed)
Amy Randolph is a 26 year old G 1 P 0 at 39w 5 days presented for IOL due to favorable cervix. Progressed to 6 cm and has had epidural and SROM this morning clear fluid. OB History     Gravida  1   Para      Term      Preterm      AB      Living         SAB      IAB      Ectopic      Multiple      Live Births             Past Medical History:  Diagnosis Date   ADHD (attention deficit hyperactivity disorder)    Bipolar disorder (HCC)    Migraine with aura, with intractable migraine, so stated, with status migrainosus    Osgood-Schlatter's disease    Raynaud's disease    Vaginal Pap smear, abnormal    Past Surgical History:  Procedure Laterality Date   FOOT SURGERY     OSTEOTOMY Bilateral 2014   WISDOM TOOTH EXTRACTION     Family History: family history includes Healthy in her father and mother; Spina bifida in her sister. Social History:  reports that she has never smoked. She has never used smokeless tobacco. She reports current alcohol use. No history on file for drug use.     Maternal Diabetes: No Genetic Screening: Normal Maternal Ultrasounds/Referrals: Normal Fetal Ultrasounds or other Referrals:  None Maternal Substance Abuse:  No Significant Maternal Medications:  None Significant Maternal Lab Results:  None Other Comments:  None  Review of Systems  All other systems reviewed and are negative. History Dilation: 6 Effacement (%): 80 Station: -2 Exam by:: Ashley Mariner RNC Blood pressure 109/73, pulse 71, temperature 98.2 F (36.8 C), temperature source Oral, resp. rate 17, height 5\' 6"  (1.676 m), weight 81.1 kg, last menstrual period 04/18/2020, SpO2 96 %. Maternal Exam:  Uterine Assessment: Contraction strength is moderate.  Contraction frequency is regular.  Abdomen: Fetal presentation: vertex   Fetal Exam Fetal State Assessment: Category I - tracings are normal.  Physical Exam Vitals and nursing note reviewed. Exam conducted  with a chaperone present.  Constitutional:      Appearance: Normal appearance.  HENT:     Head: Normocephalic.  Eyes:     Pupils: Pupils are equal, round, and reactive to light.  Cardiovascular:     Rate and Rhythm: Normal rate and regular rhythm.  Pulmonary:     Effort: Pulmonary effort is normal.  Musculoskeletal:     Cervical back: Normal range of motion.  Neurological:     Mental Status: She is alert.    Prenatal labs: ABO, Rh: --/--/O POS (01/25 0030) Antibody: NEG (01/25 0030) Rubella: Immune (06/24 0000) RPR: Nonreactive (06/24 0000)  HBsAg: Negative (06/24 0000)  HIV: Non-reactive (06/24 0000)  GBS: Negative/-- (01/03 0000)   Assessment/Plan: IUP at term IOL  Anticipate NSVD   12-26-1978 03/25/2021, 7:40 AM

## 2021-03-25 NOTE — Anesthesia Procedure Notes (Signed)
Epidural Patient location during procedure: OB Start time: 03/25/2021 5:54 AM End time: 03/25/2021 6:05 AM  Staffing Anesthesiologist: Lucretia Kern, MD Performed: anesthesiologist   Preanesthetic Checklist Completed: patient identified, IV checked, risks and benefits discussed, monitors and equipment checked, pre-op evaluation and timeout performed  Epidural Patient position: sitting Prep: DuraPrep Patient monitoring: heart rate, continuous pulse ox and blood pressure Approach: midline Location: L3-L4 Injection technique: LOR air  Needle:  Needle type: Tuohy  Needle gauge: 17 G Needle length: 9 cm Needle insertion depth: 5 cm Catheter type: closed end flexible Catheter size: 19 Gauge Catheter at skin depth: 10 cm Test dose: negative  Assessment Events: blood not aspirated, injection not painful, no injection resistance, no paresthesia and negative IV test  Additional Notes Reason for block:procedure for pain

## 2021-03-25 NOTE — Lactation Note (Signed)
This note was copied from a baby's chart. Lactation Consultation Note  Patient Name: Amy Randolph OVFIE'P Date: 03/25/2021 Reason for consult: L&D Initial assessment;Mother's request;Primapara;1st time breastfeeding;Term;Breastfeeding assistance Age:26 hours  LC assisted with latch in cross cradle with signs of milk transfer. Mom to feed by cues 8-12x 24hr period.   Mom feeding plan to EBF and in future offer EBM via bottle.  All questions answered at the end of the session   Maternal Data Has patient been taught Hand Expression?: Yes Does the patient have breastfeeding experience prior to this delivery?: No  Feeding Mother's Current Feeding Choice: Breast Milk  LATCH Score Latch: Repeated attempts needed to sustain latch, nipple held in mouth throughout feeding, stimulation needed to elicit sucking reflex.  Audible Swallowing: A few with stimulation  Type of Nipple: Everted at rest and after stimulation  Comfort (Breast/Nipple): Soft / non-tender  Hold (Positioning): Assistance needed to correctly position infant at breast and maintain latch.  LATCH Score: 7   Lactation Tools Discussed/Used    Interventions Interventions: Breast feeding basics reviewed;Assisted with latch;Skin to skin;Hand express;Breast compression;Expressed milk;Education  Discharge Pump: Personal WIC Program: No  Consult Status Consult Status: Follow-up from L&D Date: 03/26/21 Follow-up type: In-patient    Amy Cargile  Randolph 03/25/2021, 2:29 PM

## 2021-03-25 NOTE — Anesthesia Preprocedure Evaluation (Signed)
Anesthesia Evaluation  Patient identified by MRN, date of birth, ID band Patient awake    Reviewed: Allergy & Precautions, H&P , NPO status , Patient's Chart, lab work & pertinent test results  History of Anesthesia Complications Negative for: history of anesthetic complications  Airway Mallampati: II  TM Distance: >3 FB     Dental   Pulmonary neg pulmonary ROS,    Pulmonary exam normal        Cardiovascular negative cardio ROS   Rhythm:regular Rate:Normal     Neuro/Psych  Headaches, Bipolar Disorder negative psych ROS   GI/Hepatic negative GI ROS, Neg liver ROS,   Endo/Other  negative endocrine ROS  Renal/GU negative Renal ROS  negative genitourinary   Musculoskeletal   Abdominal   Peds  Hematology negative hematology ROS (+)   Anesthesia Other Findings   Reproductive/Obstetrics (+) Pregnancy                            Anesthesia Physical Anesthesia Plan  ASA: 2  Anesthesia Plan: Epidural   Post-op Pain Management:    Induction:   PONV Risk Score and Plan:   Airway Management Planned:   Additional Equipment:   Intra-op Plan:   Post-operative Plan:   Informed Consent: I have reviewed the patients History and Physical, chart, labs and discussed the procedure including the risks, benefits and alternatives for the proposed anesthesia with the patient or authorized representative who has indicated his/her understanding and acceptance.       Plan Discussed with:   Anesthesia Plan Comments:         Anesthesia Quick Evaluation

## 2021-03-26 LAB — CBC
HCT: 31.2 % — ABNORMAL LOW (ref 36.0–46.0)
Hemoglobin: 10.5 g/dL — ABNORMAL LOW (ref 12.0–15.0)
MCH: 29.7 pg (ref 26.0–34.0)
MCHC: 33.7 g/dL (ref 30.0–36.0)
MCV: 88.1 fL (ref 80.0–100.0)
Platelets: 199 10*3/uL (ref 150–400)
RBC: 3.54 MIL/uL — ABNORMAL LOW (ref 3.87–5.11)
RDW: 14.2 % (ref 11.5–15.5)
WBC: 18 10*3/uL — ABNORMAL HIGH (ref 4.0–10.5)
nRBC: 0 % (ref 0.0–0.2)

## 2021-03-26 NOTE — Clinical Social Work Maternal (Signed)
CLINICAL SOCIAL WORK MATERNAL/CHILD NOTE  Patient Details  Name: Amy Randolph MRN: [id number] Date of Birth: 03/28/1995  Date:  03/26/2021  Clinical Social Worker Initiating Note:  Amy Randolph, [place] Date/Time: Initiated:  03/26/21/0915     Child's Name:  Amy Randolph   Biological Parents:  Mother, Father   Need for Interpreter:  None   Reason for Referral:  Behavioral Health Concerns   Address:  [address] [place] [address]    Phone number:  [phone] (home)     Additional phone number:   Household Members/Support Persons (HM/SP):   Household Member/Support Person 1   HM/SP Name Relationship DOB or Age  HM/SP -1 Amy Randolph Significant Other 07-18-1990  HM/SP -2        HM/SP -3        HM/SP -4        HM/SP -5        HM/SP -6        HM/SP -7        HM/SP -8          Natural Supports (not living in the home):  Extended Family   Professional Supports: None   Employment: Unemployed   Type of Work:     Education:  Programmer, systems   Homebound arranged:    Museum/gallery curator Resources:  Medicaid   Other Resources:  Physicist, medical  , [place] Considerations Which May Impact Care:    Strengths:  Ability to meet basic needs  , Home prepared for child  , Pediatrician chosen   Psychotropic Medications:         Pediatrician:    [place]  Pediatrician List:   [facility]      Pediatrician Fax Number:    Risk Factors/Current Problems:      Cognitive State:  Linear Thinking  , Able to Concentrate     Mood/Affect:  Calm  , Comfortable     CSW Assessment: : CSW received consult for hx Bipolar and ADHD. CSW met with MOB to offer support and complete assessment.     CSW met with MOB at bedside and introduced CSW role. CSW observed MOB lying in bed, maternal grandmother holding the infant and FOB present at bedside. CSW  offered MOB privacy. MOB presented calm and welcomed CSW to complete the assessment with her mom and FOB present. MOB confirmed the demographic information on file was correct and stated that she and FOB live together. MOB identified FOB, her family and in laws as supports.  MOB reported she receives FS and will apply for the [facility]. CSW provided MOB with WIC information to follow up. CSW inquired how MOB has felt since giving birth. MOB reported feeling good with a good L&D experience after the epidural. CSW inquired about MOB mental health history. MOB reported she was diagnosed with Bipolar in 2016-2017 while in college and ADHD as a child. MOB reported she treated her symptoms with Wellbutrin and Lamotrigine however she no longer takes the medication. MOB reported she saw a therapist several years ago and is not currently interested. CSW inquired about MOB coping strategies. MOB reported she likes to surround herself with family, likes to read, takes baths and enjoys shopping at [employer]. CSW praised MOB for efforts and encouraged her to continue using her coping strategies. MOB was knowledgeable of  PPD and receptive to resources. CSW provided education regarding the baby blues period vs. perinatal mood disorders, discussed treatment and gave resources for mental health follow up. CSW recommended MOB complete a self-evaluation during the postpartum time period using the New Mom Checklist from Postpartum Progress and encouraged MOB to contact a medical professional if symptoms are noted at any time. MOB reported she feels comfortable reaching out to her doctor if she has concerns. MOB denied SI/HI.   MOB reported she has essential items for the infant including a bassinet where the infant will sleep. MOB has chosen [facility] for the infant's follow up care. CSW provided review of Sudden Infant Death Syndrome (SIDS) precautions. MOB reported understanding. CSW assessed MOB for additional needs. MOB reported no  further need.   CSW identifies no further need for intervention and no barriers to discharge at this time.   CSW Plan/Description:  Sudden Infant Death Syndrome (SIDS) Education, Perinatal Mood and Anxiety Disorder (PMADs) Education, No Further Intervention Required/No Barriers to Discharge    Amy Hopping, LCSW 03/26/2021, 11:19 AM

## 2021-03-26 NOTE — Lactation Note (Signed)
This note was copied from a baby's chart. Lactation Consultation Note Mom wants to wait until in the morning to be seen by Lactation.  Patient Name: Amy Randolph [id number] Date: 03/26/2021   Age:26 hours  Maternal Data    Feeding    LATCH Score                    Lactation Tools Discussed/Used    Interventions    Discharge    Consult Status      Charyl Dancer 03/26/2021, 4:44 AM

## 2021-03-26 NOTE — Progress Notes (Signed)
Postpartum Progress Note  Post Partum Day 1 s/p spontaneous vaginal delivery.  Patient reports well-controlled pain, ambulating without difficulty, voiding spontaneously, tolerating PO.  Vaginal bleeding is appropriate.   Objective: Blood pressure 98/65, pulse 89, temperature 98.2 F (36.8 C), temperature source Oral, resp. rate 18, height 5\' 6"  (1.676 m), weight 81.1 kg, last menstrual period 04/18/2020, SpO2 97 %, unknown if currently breastfeeding.  Physical Exam:  General: alert and no distress Lochia: appropriate Uterine Fundus: firm DVT Evaluation: No evidence of DVT seen on physical exam.  Recent Labs    03/25/21 0030 03/26/21 0430  HGB 13.1 10.5*  HCT 39.0 31.2*    Assessment/Plan: Postpartum Day 1, s/p vaginal delivery. Continue routine postpartum care Lactation following Anticipate discharge home possibly later today or tomorrow.   LOS: 1 day   03/28/21 03/26/2021, 7:37 AM

## 2021-03-26 NOTE — Anesthesia Postprocedure Evaluation (Signed)
Anesthesia Post Note  Patient: Music therapist  Procedure(s) Performed: AN AD HOC LABOR EPIDURAL     Patient location during evaluation: Mother Baby Anesthesia Type: Epidural Level of consciousness: awake and alert and oriented Pain management: satisfactory to patient Vital Signs Assessment: post-procedure vital signs reviewed and stable Respiratory status: respiratory function stable Cardiovascular status: stable Postop Assessment: no headache, no backache, epidural receding, patient able to bend at knees, no signs of nausea or vomiting, adequate PO intake and able to ambulate Anesthetic complications: no   No notable events documented.  Last Vitals:  Vitals:   03/26/21 0059 03/26/21 0500  BP: 99/63 98/65  Pulse: 81 89  Resp: 18 18  Temp: 36.8 C 36.8 C  SpO2: 97% 97%    Last Pain:  Vitals:   03/26/21 0813  TempSrc:   PainSc: 4    Pain Goal:                   Amy Randolph

## 2021-03-26 NOTE — Lactation Note (Addendum)
This note was copied from a baby's chart. Lactation Consultation Note  Patient Name: Amy Randolph [id number] Date: 03/26/2021 Reason for consult: Follow-up assessment;Mother's request;Primapara;Term Age:26 hours   Mom called me for latch assist. [place]" latched with relative ease, but then fell asleep. Mom reported that infant fed longer with previous feedings. Hand expression was taught to Mom & the resulting EBM was spoon-fed/given on a gloved finger. Infant showed excellent tongue movement. Mom has hx nipple piercings (removed recently).  Plan: Offer breast with cues. If infant doesn't feed or isn't cueing, do hand expression and spoon feed (Mom & MGM know not to offer spoon feeding if infant's tongue is resting on roof of mouth).   Discharge Pump: Personal (Mom has an Elvie & a Medela DEBP at home) [facility] Program: No   Lurline Hare [facility] 03/26/2021, 9:20 AM

## 2021-03-26 NOTE — Lactation Note (Signed)
This note was copied from a baby's chart. Lactation Consultation Note  Patient Name: Girl Cybill Uriegas JSEGB'T Date: 03/26/2021   Age:26 hours  Mom is a P1 who reports + breast changes w/pregnancy. Mom says that "Amelia" "latches really well." Mom denies discomfort with latch. Mom will call for me to return to observe latch.   Mom was made aware of O/P services, breastfeeding support groups, and our phone # for post-discharge questions.    Lurline Hare [facility] 03/26/2021, 7:59 AM

## 2021-03-27 MED ORDER — PRENATAL MULTIVITAMIN CH: 1.0000 | ORAL_TABLET | Freq: Every day | ORAL | 1 refills | Status: AC

## 2021-03-27 MED ORDER — ACETAMINOPHEN 325 MG PO TABS: 650.0000 mg | ORAL_TABLET | Freq: Four times a day (QID) | ORAL | 0 refills | Status: AC | PRN

## 2021-03-27 MED ORDER — IBUPROFEN 600 MG PO TABS: 600.0000 mg | ORAL_TABLET | Freq: Four times a day (QID) | ORAL | 0 refills | Status: AC | PRN

## 2021-03-27 NOTE — Discharge Summary (Signed)
Postpartum Discharge Summary  Date of Service updated 03/27/21     Patient Name: Amy Randolph DOB: 01/16/1996 MRN: [id number]  Date of admission: 03/25/2021 Delivery date:03/25/2021  Delivering provider: Dian Queen  Date of discharge: 03/27/2021  Admitting diagnosis: Encounter for elective induction of labor [Z34.90] Vacuum extractor delivery, delivered [O75.9] Intrauterine pregnancy: [redacted]w[redacted]d    Secondary diagnosis:  Principal Problem:   Encounter for elective induction of labor Active Problems:   Vacuum extractor delivery, delivered  Additional problems:     Discharge diagnosis: Term Pregnancy Delivered                                              Post partum procedures: Augmentation: AROM and Pitocin Complications: None  Hospital course: Induction of Labor With Vaginal Delivery   26y.o. yo G1P1001 at 367w5das admitted to the hospital 03/25/2021 for induction of labor.  Indication for induction: Favorable cervix at term.  Patient had an uncomplicated labor course as follows: Membrane Rupture Time/Date: 7:28 AM ,03/25/2021   Delivery Method:Vaginal, Vacuum (Extractor)  Episiotomy: None  Lacerations:  1st degree  Details of delivery can be found in separate delivery note.  Patient had a routine postpartum course. Patient is discharged home 03/27/21.  Newborn Data: Birth date:03/25/2021  Birth time:1:27 PM  Gender:Female  Living status:Living  Apgars:8 ,9  Weight:3620 g   Magnesium Sulfate received: No BMZ received: No Rhophylac:No MMR:No T-DaP:Given prenatally Flu: No Transfusion:No  Physical exam  Vitals:   03/26/21 0059 03/26/21 0500 03/26/21 1948 03/27/21 0510  BP: 99/63 98/65 97/62  99/70  Pulse: 81 89 79 80  Resp: 18 18 18 16   Temp: 98.3 F (36.8 C) 98.2 F (36.8 C) 98.7 F (37.1 C) 98.1 F (36.7 C)  TempSrc: Oral Oral Axillary Oral  SpO2: 97% 97% 99% 98%  Weight:      Height:       General: alert, cooperative, and no distress Lochia:  appropriate Uterine Fundus: firm Incision: Healing well with no significant drainage DVT Evaluation: No evidence of DVT seen on physical exam. Labs: Lab Results  Component Value Date   WBC 18.0 (H) 03/26/2021   HGB 10.5 (L) 03/26/2021   HCT 31.2 (L) 03/26/2021   MCV 88.1 03/26/2021   PLT 199 03/26/2021   No flowsheet data found. Edinburgh Score: Edinburgh Postnatal Depression Scale Screening Tool 03/26/2021  I have been able to laugh and see the funny side of things. 0  I have looked forward with enjoyment to things. 0  I have blamed myself unnecessarily when things went wrong. 1  I have been anxious or worried for no good reason. 0  I have felt scared or panicky for no good reason. 0  Things have been getting on top of me. 0  I have been so unhappy that I have had difficulty sleeping. 0  I have felt sad or miserable. 0  I have been so unhappy that I have been crying. 0  The thought of harming myself has occurred to me. 0  Edinburgh Postnatal Depression Scale Total 1      After visit meds:  Allergies as of 03/27/2021   No Known Allergies      Medication List     STOP taking these medications    benzonatate 100 MG capsule Commonly known as: TESSALON   diclofenac 75 MG EC  tablet Commonly known as: VOLTAREN   etonogestrel-ethinyl estradiol 0.12-0.015 MG/24HR vaginal ring Commonly known as: NUVARING   fluticasone 50 MCG/ACT nasal spray Commonly known as: FLONASE   lidocaine 2 % solution Commonly known as: XYLOCAINE   montelukast 10 MG tablet Commonly known as: SINGULAIR   Norethindrone Acetate-Ethinyl Estradiol 1.5-30 MG-MCG tablet Commonly known as: LOESTRIN   promethazine-dextromethorphan 6.25-15 MG/5ML syrup Commonly known as: PROMETHAZINE-DM   tamsulosin 0.4 MG Caps capsule Commonly known as: FLOMAX   Wellbutrin XL 150 MG 24 hr tablet Generic drug: buPROPion   Xulane 150-35 MCG/24HR transdermal patch Generic drug: norelgestromin-ethinyl  estradiol   Zafemy 150-35 MCG/24HR transdermal patch Generic drug: norelgestromin-ethinyl estradiol       TAKE these medications    acetaminophen 325 MG tablet Commonly known as: Tylenol Take 2 tablets (650 mg total) by mouth every 6 (six) hours as needed (for pain scale < 4).   ibuprofen 600 MG tablet Commonly known as: ADVIL Take 1 tablet (600 mg total) by mouth every 6 (six) hours as needed.   prenatal multivitamin Tabs tablet Take 1 tablet by mouth daily at 12 noon.         Discharge home in stable condition Infant Feeding: Breast Infant Disposition:home with mother Discharge instruction: per After Visit Summary and Postpartum booklet. Activity: Advance as tolerated. Pelvic rest for 6 weeks.  Diet: routine diet Anticipated Birth Control: Unsure Postpartum Appointment:6 weeks Additional Postpartum F/U:  Future Appointments:No future appointments. Follow up Visit:      03/27/2021 Allena Katz, MD

## 2021-03-27 NOTE — Lactation Note (Signed)
This note was copied from a baby's chart. Lactation Consultation Note  Patient Name: Amy Randolph [id number]'M Date: 03/27/2021 Reason for consult: Follow-up assessment;Primapara;1st time breastfeeding;Term;Infant weight loss;Other (Comment) (7 % weight loss/ per mom baby recently breast fed and baby awake and calm at present. per mom mentioned initially sore when baby latches. LC offered to assess breast tissue and mom receptive.) Age:26 hours LC reviewed the doc flow sheets and baby fed at 0906 for 13 mins .  LC assessed breast tissue per moms request and noted intact positional strips on both nipples and areola edema when mom compressed the areolas. No breakdown.  LC reviewed reverses pressure exercise and showed mom. Areola was more compressible. Provided the Shells with instructions.  LC reviewed BF D/C teaching.  Mom aware of the [facility] resources after D/C.   Maternal Data Has patient been taught Hand Expression?: Yes  Feeding Mother's Current Feeding Choice: Breast Milk  LATCH Score                    Lactation Tools Discussed/Used    Interventions Interventions: Breast feeding basics reviewed;Shells;Hand pump;Education;LC Services brochure  Discharge Discharge Education: Engorgement and breast care;Warning signs for feeding baby Pump: Personal;Manual;DEBP  Consult Status Consult Status: Complete Date: 03/27/21    Amy Randolph 03/27/2021, 9:53 AM

## 2021-04-07 ENCOUNTER — Telehealth (HOSPITAL_COMMUNITY): Payer: Self-pay

## 2021-04-07 NOTE — Telephone Encounter (Signed)
"  I've been good. Everything is going great." Patient declines questions or concerns about her healing.  "She's doing good. Eating and gaining weight well. She sleeps in a bassinet next to my bed." RN reviewed ABC's of safe sleep with patient. Patient declines any questions or concerns about baby.  EPDS score is 4.  Marcelino Duster [facility] 04/07/2021,1350

## 2022-07-12 ENCOUNTER — Ambulatory Visit: Admit: 2022-07-12 | Payer: Medicaid Other
